# Patient Record
Sex: Male | Born: 1985 | Race: Black or African American | Hispanic: No | Marital: Single | Smoking: Never smoker
Health system: Southern US, Community
[De-identification: ages and names within clinical notes are randomized; demographics above are authoritative.]

## PROBLEM LIST (undated history)

## (undated) DIAGNOSIS — J302 Other seasonal allergic rhinitis: Secondary | ICD-10-CM

---
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
## 2007-02-17 ENCOUNTER — Emergency Department (HOSPITAL_COMMUNITY): Admission: EM | Admit: 2007-02-17 | Discharge: 2007-02-17 | Payer: Self-pay | Admitting: Emergency Medicine
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
## 2010-11-18 NOTE — Op Note (Signed)
[place]. [facility]  Patient:    Gavin Francis, Gavin Francis Visit Number: [id number] MRN: [id number]          Service Type: DSU Location: [facility] Attending Physician:  Gustavus Messing Dictated by:   Yaakov Guthrie. Shon Hough, M.D. Proc. Date: 05/20/01 Admit Date:  05/20/2001                             Operative Report  INDICATIONS:  A 25 year old who has severe, severe gynecomastia - right side greater than left - as well as increased accessory breast tissue.  PROCEDURES DONE:  Bilateral subcutaneous mastectomies as well as excision of accessory breast tissue in the latissimus dorsi/serratus anterior areas.  ATTENDING:  Yaakov Guthrie. Shon Hough, M.D.  ANESTHESIA:  General.  DESCRIPTION:  Preoperatively, the patient was set up and drawn for the inferior pedicle reduction mammoplasty.  The drawings included the centralized portion of the gynecomastia as well as tail of Spence, as well as tremendous amounts of accessory breast tissue involving the right and left lateral chest, axillary, latissimus dorsi, and upper axillary regions.  After the patient received proper general anesthesia and intubated orally, prep was done to the chest and breast areas in the routine fashion using Betadine soap and solution, walled off with sterile towels, and draped so as to make a sterile field.  Xylocaine 0.25% with epinephrine was injected locally, approximately 150 cc per side, 1:400,000 concentration.  After awaiting an appropriate amount of time for vasoconstriction to take place, curvilinear incisions were made at the areolas from the 3 oclock to 9 oclock position.  The periareolar incision was opened down to underlying subcutaneous tissue and breast tissue.  Using sharp dissection and curved Mayo scissors I was able to sharply dissect out subcutaneously the excess breast tissue which was very glandular in consistency.  We left a small tuft of tissue under each nipple to prevent  saucerization effects postoperatively.  Out laterally, more tissue was removed using fiberoptic illumination.  Next, bulky areas over the lateral breast, accessory breast tissue over the axillary, latissimus dorsi, and serratus anterior areas were removed using a liposuction technique with [place] catheter 28 - 3 and 4s, removing volumes of accessory breast tissue from the areas.  This was carried out on both sides, the right side greater than the left.  After proper hemostasis and irrigation the wounds were then reclosed in layers using 3-0 Monocryl in the subcu plane and then a running subcuticular stitch of 3-0 Monocryl.  The drains were brought out through the lateral-most portion of the chest and secured with 3-0 Prolene.  They were #10 Blake drains, fully fluted.  The wounds were cleansed; Steri-Strips and soft bulky dressings applied to all the areas.  He withstood the procedures very well and was taken to recovery in excellent condition. Dictated by:   Yaakov Guthrie. Shon Hough, M.D. Attending Physician:  Gustavus Messing DD:  05/20/01 TD:  05/20/01 Job: [id number]

## 2014-02-16 ENCOUNTER — Encounter (HOSPITAL_COMMUNITY): Payer: Self-pay | Admitting: Emergency Medicine

## 2014-02-16 ENCOUNTER — Emergency Department (HOSPITAL_COMMUNITY)
Admission: EM | Admit: 2014-02-16 | Discharge: 2014-02-16 | Disposition: A | Discharge status: Home / Self Care | Payer: Self-pay | Attending: Emergency Medicine | Admitting: Emergency Medicine

## 2014-02-16 ENCOUNTER — Emergency Department (HOSPITAL_COMMUNITY): Payer: Self-pay

## 2014-02-16 DIAGNOSIS — T63461A Toxic effect of venom of wasps, accidental (unintentional), initial encounter: Secondary | ICD-10-CM | POA: Insufficient documentation

## 2014-02-16 DIAGNOSIS — Z79899 Other long term (current) drug therapy: Secondary | ICD-10-CM | POA: Insufficient documentation

## 2014-02-16 DIAGNOSIS — Y9289 Other specified places as the place of occurrence of the external cause: Secondary | ICD-10-CM | POA: Insufficient documentation

## 2014-02-16 DIAGNOSIS — T6391XA Toxic effect of contact with unspecified venomous animal, accidental (unintentional), initial encounter: Secondary | ICD-10-CM | POA: Insufficient documentation

## 2014-02-16 DIAGNOSIS — T63481A Toxic effect of venom of other arthropod, accidental (unintentional), initial encounter: Secondary | ICD-10-CM

## 2014-02-16 DIAGNOSIS — Y9389 Activity, other specified: Secondary | ICD-10-CM | POA: Insufficient documentation

## 2014-02-16 LAB — I-STAT TROPONIN, ED: Troponin i, poc: 0 ng/mL (ref 0.00–0.08)

## 2014-02-16 LAB — CBC
HCT: 45 % (ref 39.0–52.0)
Hemoglobin: 15.8 g/dL (ref 13.0–17.0)
MCH: 29.8 pg (ref 26.0–34.0)
MCHC: 35.1 g/dL (ref 30.0–36.0)
MCV: 84.9 fL (ref 78.0–100.0)
PLATELETS: 207 10*3/uL (ref 150–400)
RBC: 5.3 MIL/uL (ref 4.22–5.81)
RDW: 13.1 % (ref 11.5–15.5)
WBC: 4.3 10*3/uL (ref 4.0–10.5)

## 2014-02-16 LAB — BASIC METABOLIC PANEL
ANION GAP: 10 (ref 5–15)
BUN: 15 mg/dL (ref 6–23)
CALCIUM: 8.8 mg/dL (ref 8.4–10.5)
CO2: 27 mEq/L (ref 19–32)
Chloride: 106 mEq/L (ref 96–112)
Creatinine, Ser: 1.13 mg/dL (ref 0.50–1.35)
GFR calc Af Amer: >90 mL/min (ref >90)
GFR, EST NON AFRICAN AMERICAN: 88 mL/min — AB (ref >90)
GLUCOSE: 111 mg/dL — AB (ref 70–99)
Potassium: 4.3 mEq/L (ref 3.7–5.3)
Sodium: 143 mEq/L (ref 137–147)

## 2014-02-16 MED ORDER — DIPHENHYDRAMINE HCL 25 MG PO CAPS
25.0000 mg | ORAL_CAPSULE | Freq: Once | ORAL | Status: AC
  Administered 2014-02-16: 25 mg via ORAL
  Filled 2014-02-16: qty 1

## 2014-02-16 MED ORDER — EPINEPHRINE 0.3 MG/0.3ML IJ SOAJ: 0.3000 mg | Freq: Once | INTRAMUSCULAR | Status: DC

## 2014-02-16 MED ORDER — DIPHENHYDRAMINE HCL 25 MG PO TABS: 25.0000 mg | ORAL_TABLET | Freq: Four times a day (QID) | ORAL | Status: DC

## 2014-02-16 NOTE — ED Provider Notes (Signed)
CSN: [id number]     Arrival date & time 02/16/14  1931 History   First MD Initiated Contact with Patient 02/16/14 2305     Chief Complaint  Patient presents with  . Chest Pain  . Insect Bite     (Consider location/radiation/quality/duration/timing/severity/associated sxs/prior Treatment) HPI 28 year old male presents to emergency department from his place of work after being stung by what he thinks to be a wasp.  He describes it as red and black.  Patient was stung on his right hand in between his fifth and fourth fingers.  A few minutes after being stung he began to feel nauseated, hot, headache and short of breath.  He describes a dull chest pain to his left lower chest.  Symptoms have since resolved.  No prior history of insect sting. History reviewed. No pertinent past medical history. History reviewed. No pertinent past surgical history. History reviewed. No pertinent family history. History  Substance Use Topics  . Smoking status: Never Smoker   . Smokeless tobacco: Not on file  . Alcohol Use: Yes     Comment: occasional    Review of Systems  See History of Present Illness; otherwise all other systems are reviewed and negative   Allergies  Hornet venom  Home Medications   Prior to Admission medications   Medication Sig Start Date End Date Taking? Authorizing Provider  hydrocortisone cream 1 % Apply 1 application topically 2 (two) times a week. Tuesdays and Thursdays (for healing at the injection site from plasma donations)   Yes Historical Provider, MD  Naphazoline-Pheniramine (OPCON-A) 0.027-0.315 % SOLN Place 1-2 drops into both eyes 3 (three) times daily as needed (allergies).   Yes Historical Provider, MD  diphenhydrAMINE (BENADRYL) 25 MG tablet Take 1 tablet (25 mg total) by mouth every 6 (six) hours. 02/16/14   Olivia Mackielga M Juwann Sherk, MD  EPINEPHrine (EPIPEN) 0.3 mg/0.3 mL IJ SOAJ injection Inject 0.3 mLs (0.3 mg total) into the muscle once. 02/16/14   Olivia Mackielga M Zahi Plaskett, MD   BP  118/77  Pulse 67  Temp(Src) 98.6 F (37 C) (Oral)  Resp 18  Ht 5\' 9"  (1.753 m)  Wt 240 lb (108.863 kg)  BMI 35.43 kg/m2  SpO2 97% Physical Exam  Nursing note and vitals reviewed. Constitutional: He is oriented to person, place, and time. He appears well-developed and well-nourished.  HENT:  Head: Normocephalic and atraumatic.  Nose: Nose normal.  Mouth/Throat: Oropharynx is clear and moist.  Eyes: Conjunctivae and EOM are normal. Pupils are equal, round, and reactive to light.  Neck: Normal range of motion. Neck supple. No JVD present. No tracheal deviation present. No thyromegaly present.  Cardiovascular: Normal rate, regular rhythm, normal heart sounds and intact distal pulses.  Exam reveals no gallop and no friction rub.   No murmur heard. Pulmonary/Chest: Effort normal and breath sounds normal. No stridor. No respiratory distress. He has no wheezes. He has no rales. He exhibits no tenderness.  Abdominal: Soft. Bowel sounds are normal. He exhibits no distension and no mass. There is no tenderness. There is no rebound and no guarding.  Musculoskeletal: Normal range of motion. He exhibits no edema and no tenderness.  Lymphadenopathy:    He has no cervical adenopathy.  Neurological: He is alert and oriented to person, place, and time. He has normal reflexes. No cranial nerve deficit. He exhibits normal muscle tone. Coordination normal.  Skin: Skin is warm and dry. No rash noted. No erythema. No pallor.  Patient has what appears to be in  sats staying in the webbing between fifth and fourth fingers on right hand.  There is a small amount of localized soft tissue swelling.  Psychiatric: He has a normal mood and affect. His behavior is normal. Judgment and thought content normal.    ED Course  Procedures (including critical care time) Labs Review Labs Reviewed  BASIC METABOLIC PANEL - Abnormal; Notable for the following:    Glucose, Bld 111 (*)    GFR calc non Af Amer 88 (*)    All  other components within normal limits  CBC  I-STAT TROPOININ, ED    Imaging Review Dg Chest 2 View  02/16/2014   CLINICAL DATA:  Shortness of breath.  Recent wasp sting.  EXAM: CHEST  2 VIEW  COMPARISON:  10/01/2006  FINDINGS: There is interstitial coarsening which is likely from hypoaeration with interstitial crowding. No edema or consolidation. No effusion or pneumothorax. Normal heart size at upper mediastinal contours.  IMPRESSION: Mildly low lung volumes.  No acute findings.   Electronically Signed   By: Tiburcio Pea M.D.   On: 02/16/2014 22:47     EKG Interpretation   Date/Time:  Monday February 16 2014 19:40:19 EDT Ventricular Rate:  78 PR Interval:  166 QRS Duration: 74 QT Interval:  344 QTC Calculation: 392 R Axis:   50 Text Interpretation:  Normal sinus rhythm Nonspecific T wave abnormality  Abnormal ECG No old tracing to compare Confirmed by Kanan Sobek  MD, Oza Oberle  ([id number]) on 02/16/2014 11:03:50 PM      MDM   Final diagnoses:  Insect sting, accidental or unintentional, initial encounter  Allergic reaction to insect sting, accidental or unintentional, initial encounter    28 year old male with insect sting and possible systemic reaction.  Patient without symptoms at this time.  Will start on Benadryl, get patient EpiPen prescription.    Olivia Mackie, MD 02/16/14 [phone]

## 2014-02-16 NOTE — ED Notes (Signed)
Patient here with complaint of chest pain and bee sting to right hand. States that he was stung by a large red and black insect. Shortly after the sting he began to feel lower left chest pain. Endorses shortness of breath, dizziness, and lightheadedness. Appears uncomfortable in triage. States no history of the same.

## 2014-02-16 NOTE — Discharge Instructions (Signed)

## 2015-11-17 ENCOUNTER — Emergency Department (HOSPITAL_BASED_OUTPATIENT_CLINIC_OR_DEPARTMENT_OTHER): Payer: Self-pay

## 2015-11-17 ENCOUNTER — Encounter (HOSPITAL_BASED_OUTPATIENT_CLINIC_OR_DEPARTMENT_OTHER): Payer: Self-pay

## 2015-11-17 ENCOUNTER — Emergency Department (HOSPITAL_BASED_OUTPATIENT_CLINIC_OR_DEPARTMENT_OTHER)
Admission: EM | Admit: 2015-11-17 | Discharge: 2015-11-17 | Disposition: A | Discharge status: Home / Self Care | Payer: Self-pay | Attending: Emergency Medicine | Admitting: Emergency Medicine

## 2015-11-17 DIAGNOSIS — N50819 Testicular pain, unspecified: Secondary | ICD-10-CM

## 2015-11-17 DIAGNOSIS — R1030 Lower abdominal pain, unspecified: Secondary | ICD-10-CM

## 2015-11-17 DIAGNOSIS — N451 Epididymitis: Secondary | ICD-10-CM | POA: Insufficient documentation

## 2015-11-17 HISTORY — DX: Other seasonal allergic rhinitis: J30.2

## 2015-11-17 LAB — URINALYSIS, ROUTINE W REFLEX MICROSCOPIC
Bilirubin Urine: NEGATIVE
GLUCOSE, UA: NEGATIVE mg/dL
Hgb urine dipstick: NEGATIVE
Ketones, ur: NEGATIVE mg/dL
LEUKOCYTES UA: NEGATIVE
Nitrite: NEGATIVE
PH: 7.5 (ref 5.0–8.0)
Protein, ur: NEGATIVE mg/dL
SPECIFIC GRAVITY, URINE: 1.016 (ref 1.005–1.030)

## 2015-11-17 LAB — COMPREHENSIVE METABOLIC PANEL
ALK PHOS: 33 U/L — AB (ref 38–126)
ALT: 27 U/L (ref 17–63)
AST: 22 U/L (ref 15–41)
Albumin: 3.7 g/dL (ref 3.5–5.0)
Anion gap: 6 (ref 5–15)
BILIRUBIN TOTAL: 1.3 mg/dL — AB (ref 0.3–1.2)
BUN: 9 mg/dL (ref 6–20)
CALCIUM: 8.7 mg/dL — AB (ref 8.9–10.3)
CO2: 30 mmol/L (ref 22–32)
CREATININE: 0.94 mg/dL (ref 0.61–1.24)
Chloride: 102 mmol/L (ref 101–111)
Glucose, Bld: 90 mg/dL (ref 65–99)
Potassium: 4.1 mmol/L (ref 3.5–5.1)
Sodium: 138 mmol/L (ref 135–145)
Total Protein: 6.9 g/dL (ref 6.5–8.1)

## 2015-11-17 LAB — CBC
HEMATOCRIT: 38.1 % — AB (ref 39.0–52.0)
HEMOGLOBIN: 13.3 g/dL (ref 13.0–17.0)
MCH: 29.2 pg (ref 26.0–34.0)
MCHC: 34.9 g/dL (ref 30.0–36.0)
MCV: 83.7 fL (ref 78.0–100.0)
Platelets: 214 10*3/uL (ref 150–400)
RBC: 4.55 MIL/uL (ref 4.22–5.81)
RDW: 13.5 % (ref 11.5–15.5)
WBC: 4.4 10*3/uL (ref 4.0–10.5)

## 2015-11-17 MED ORDER — IOPAMIDOL (ISOVUE-300) INJECTION 61%
100.0000 mL | Freq: Once | INTRAVENOUS | Status: AC | PRN
  Administered 2015-11-17: 100 mL via INTRAVENOUS

## 2015-11-17 MED ORDER — HYDROCODONE-ACETAMINOPHEN 5-325 MG PO TABS: 1.0000 | ORAL_TABLET | Freq: Four times a day (QID) | ORAL | Status: AC | PRN

## 2015-11-17 MED ORDER — CIPROFLOXACIN HCL 500 MG PO TABS: 500.0000 mg | ORAL_TABLET | Freq: Two times a day (BID) | ORAL | Status: AC

## 2015-11-17 NOTE — ED Provider Notes (Signed)
CSN: [id number]     Arrival date & time 11/17/15  1358 History   First MD Initiated Contact with Patient 11/17/15 1428     Chief Complaint  Patient presents with  . Groin Pain     (Consider location/radiation/quality/duration/timing/severity/associated sxs/prior Treatment) Patient is a 30 y.o. male presenting with groin pain. The history is provided by the patient. No language interpreter was used.  Groin Pain This is a new problem. The current episode started in the past 7 days. The problem occurs constantly. The problem has been gradually worsening. Associated symptoms include abdominal pain. Pertinent negatives include no nausea. Nothing aggravates the symptoms. He has tried nothing for the symptoms. The treatment provided no relief.  Pt reports he [place] to [place].  Pt reports he developed a hemorhoid.  Pt used TUCKs and swelling decreased. Pt reports increasing rectal pain.  Pt reports pain is stabbing.  Pt has pain shoot down his legs.  Pt complains of pain in right scrotum. Pt reports sore to touch.   Past Medical History  Diagnosis Date  . Seasonal allergies    History reviewed. No pertinent past surgical history. No family history on file. Social History  Substance Use Topics  . Smoking status: Never Smoker   . Smokeless tobacco: None  . Alcohol Use: No    Review of Systems  Gastrointestinal: Positive for abdominal pain. Negative for nausea.  All other systems reviewed and are negative.     Allergies  Hornet venom and Wasp venom  Home Medications   Prior to Admission medications   Not on File   BP 131/97 mmHg  Pulse 70  Temp(Src) 99 F (37.2 C) (Oral)  Resp 20  Ht 5\' 9"  (1.753 m)  Wt 113.399 kg  BMI 36.90 kg/m2  SpO2 98% Physical Exam  Constitutional: He is oriented to person, place, and time. He appears well-developed and well-nourished.  HENT:  Head: Normocephalic.  Right Ear: External ear normal.  Left Ear: External ear normal.  Nose: Nose  normal.  Mouth/Throat: Oropharynx is clear and moist.  Eyes: Conjunctivae and EOM are normal. Pupils are equal, round, and reactive to light.  Neck: Normal range of motion.  Cardiovascular: Normal rate and regular rhythm.   Pulmonary/Chest: Effort normal.  Abdominal: He exhibits no distension. There is tenderness.  Genitourinary:  Tender right scrotum to palpation.  Rectum  Tender,  external hemorrhoid.   Musculoskeletal: Normal range of motion.  Neurological: He is alert and oriented to person, place, and time.  Psychiatric: He has a normal mood and affect.  Nursing note and vitals reviewed.   ED Course  Procedures (including critical care time) Labs Review Labs Reviewed  URINALYSIS, ROUTINE W REFLEX MICROSCOPIC [facility])    Imaging Review No results found. I have personally reviewed and evaluated these images and lab results as part of my medical decision-making.   EKG Interpretation None      MDM   Final diagnoses:  Epididymitis  Lower abdominal pain        Elson AreasLeslie K Sela Falk, PA-C 11/18/15 [phone]  Geoffery Lyonsouglas Delo, MD 11/23/15 [phone]

## 2015-11-17 NOTE — ED Notes (Signed)
Patient transported to CT 

## 2015-11-17 NOTE — Discharge Instructions (Signed)
Cipro as prescribed.  Hydrocodone as prescribed as needed for pain.  Follow-up with your primary Dr. if not improving in the next week.   Epididymitis Epididymitis is swelling (inflammation) of the epididymis. The epididymis is a cord-like structure that is located along the top and back part of the testicle. It collects and stores sperm from the testicle. This condition can also cause pain and swelling of the testicle and scrotum. Symptoms usually start suddenly (acute epididymitis). Sometimes epididymitis starts gradually and lasts for a while (chronic epididymitis). This type may be harder to treat. CAUSES In men 17 and younger, this condition is usually caused by a bacterial infection or sexually transmitted disease (STD), such as:  Gonorrhea.  Chlamydia.  In men 56 and older who do not have anal sex, this condition is usually caused by bacteria from a blockage or abnormalities in the urinary system. These can result from:  Having a tube placed into the bladder (urinary catheter).  Having an enlarged or inflamed prostate gland.  Having recent urinary tract surgery. In men who have a condition that weakens the body's defense system (immune system), such as HIV, this condition can be caused by:   Other bacteria, including tuberculosis and syphilis.  Viruses.  Fungi. Sometimes this condition occurs without infection. That may happen if urine flows backward into the epididymis after heavy lifting or straining. RISK FACTORS This condition is more likely to develop in men:  Who have unprotected sex with more than one partner.  Who have anal sex.   Who have recently had surgery.   Who have a urinary catheter.  Who have urinary problems.  Who have a suppressed immune system. SYMPTOMS  This condition usually begins suddenly with chills, fever, and pain behind the scrotum and in the testicle. Other symptoms include:   Swelling of the scrotum, testicle, or both.  Pain  whenejaculatingor urinating.  Pain in the back or belly.  Nausea.  Itching and discharge from the penis.  Frequent need to pass urine.  Redness and tenderness of the scrotum. DIAGNOSIS Your health care provider can diagnose this condition based on your symptoms and medical history. Your health care provider will also do a physical exam to ask about your symptoms and check your scrotum and testicle for swelling, pain, and redness. You may also have other tests, including:   Examination of discharge from the penis.  Urine tests for infections, such as STDs.  Your health care provider may test you for other STDs, including HIV. TREATMENT Treatment for this condition depends on the cause. If your condition is caused by a bacterial infection, oral antibiotic medicine may be prescribed. If the bacterial infection has spread to your blood, you may need to receive IV antibiotics. Nonbacterial epididymitis is treated with home care that includes bed rest and elevation of the scrotum. Surgery may be needed to treat:  Bacterial epididymitis that causes pus to build up in the scrotum (abscess).  Chronic epididymitis that has not responded to other treatments. HOME CARE INSTRUCTIONS Medicines  Take over-the-counter and prescription medicines only as told by your health care provider.   If you were prescribed an antibiotic medicine, take it as told by your health care provider. Do not stop taking the antibiotic even if your condition improves. Sexual Activity  If your epididymitis was caused by an STD, avoid sexual activity until your treatment is complete.  Inform your sexual partner or partners if you test positive for an STD. They may need to be  treated.Do not engage in sexual activity with your partner or partners until their treatment is completed. General Instructions  Return to your normal activities as told by your health care provider. Ask your health care provider what  activities are safe for you.  Keep your scrotum elevated and supported while resting. Ask your health care provider if you should wear a scrotal support, such as a jockstrap. Wear it as told by your health care provider.  If directed, apply ice to the affected area:   Put ice in a plastic bag.  Place a towel between your skin and the bag.  Leave the ice on for 20 minutes, 2-3 times per day.  Try taking a sitz bath to help with discomfort. This is a warm water bath that is taken while you are sitting down. The water should only come up to your hips and should cover your buttocks. Do this 3-4 times per day or as told by your health care provider.  Keep all follow-up visits as told by your health care provider. This is important. SEEK MEDICAL CARE IF:   You have a fever.   Your pain medicine is not helping.   Your pain is getting worse.   Your symptoms do not improve within three days.   This information is not intended to replace advice given to you by your health care provider. Make sure you discuss any questions you have with your health care provider.   Document Released: 06/16/2000 Document Revised: 03/10/2015 Document Reviewed: 11/04/2014 Elsevier Interactive Patient Education [employer]

## 2015-11-17 NOTE — ED Notes (Signed)
MD at bedside. 

## 2015-11-17 NOTE — ED Notes (Signed)
C/o rectal, groin pain and pain down down both legs x 6 days-NAD-slow steady gait

## 2015-11-18 MED FILL — HYDROCODON-APAP 5-325: 5-325 | 2 days supply | Qty: 10 | Fill #0

## 2015-11-18 MED FILL — CIPROFLOXACIN HCL 500 MG TA: 500 | 10 days supply | Qty: 20 | Fill #0

## 2016-09-19 IMAGING — US US SCROTUM
1 series · 14 of 25 positions shown · non-contrast
Comparison: CT 10/01/2006.

CLINICAL DATA: Testicular pain.

EXAM:
SCROTAL ULTRASOUND
DOPPLER ULTRASOUND OF THE TESTICLES
TECHNIQUE: Complete ultrasound examination of the testicles, epididymis, and
other scrotal structures was performed. Color and spectral Doppler
ultrasound were also utilized to evaluate blood flow to the
testicles.

[Series 1: us scrotum · 0.07mm/px · 14 of 51 slices shown]
[im 1/51]
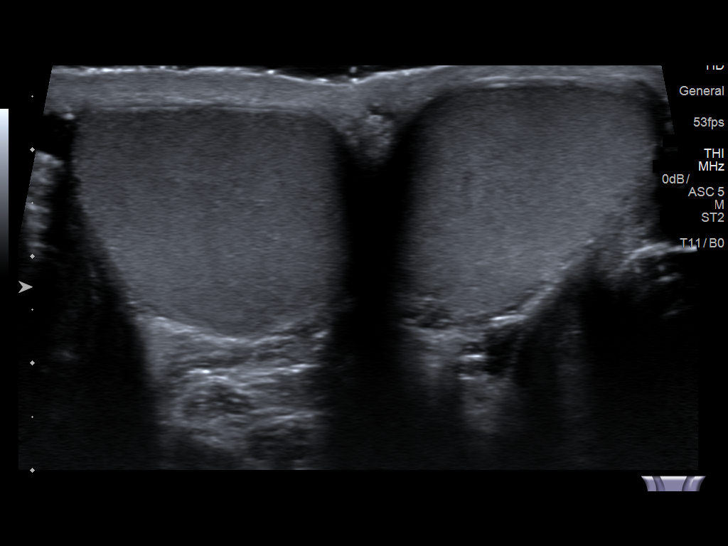
[im 5/51]
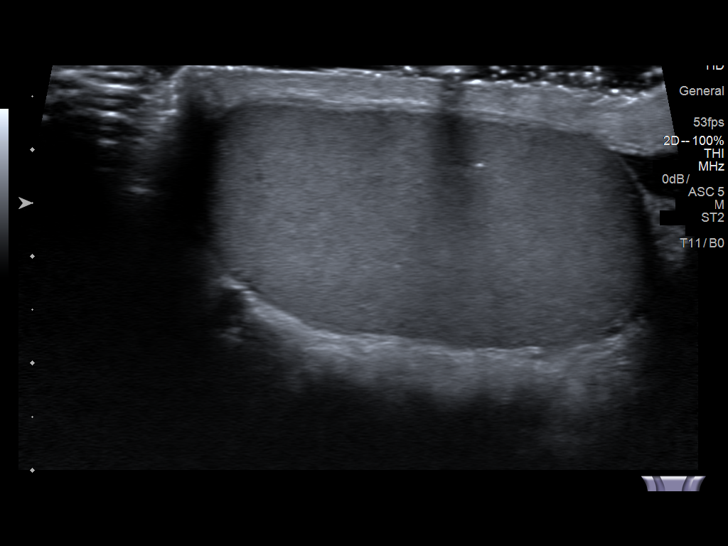
[im 9/51]
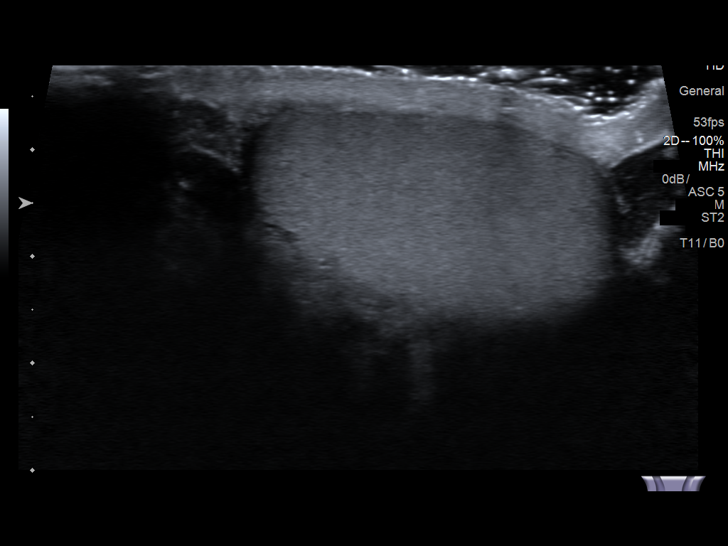
[im 13/51]
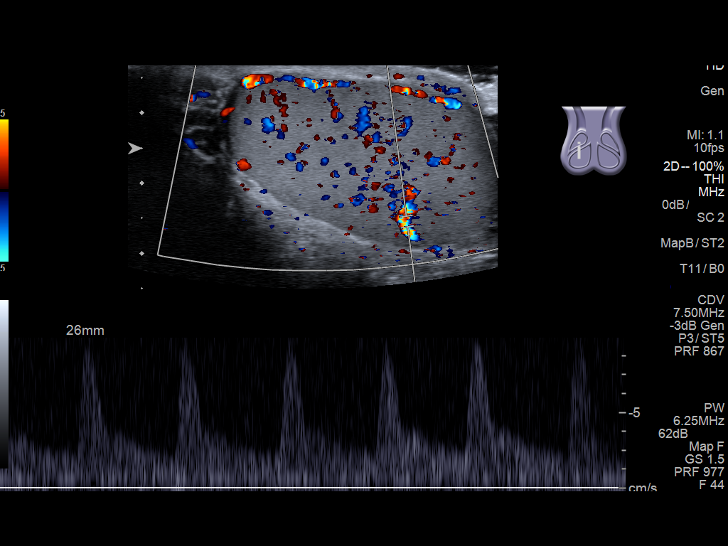
[im 17/51]
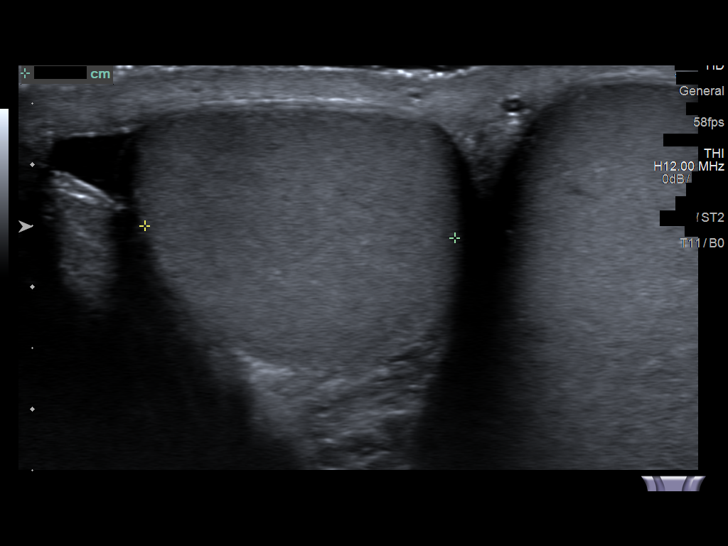
[im 19/51]
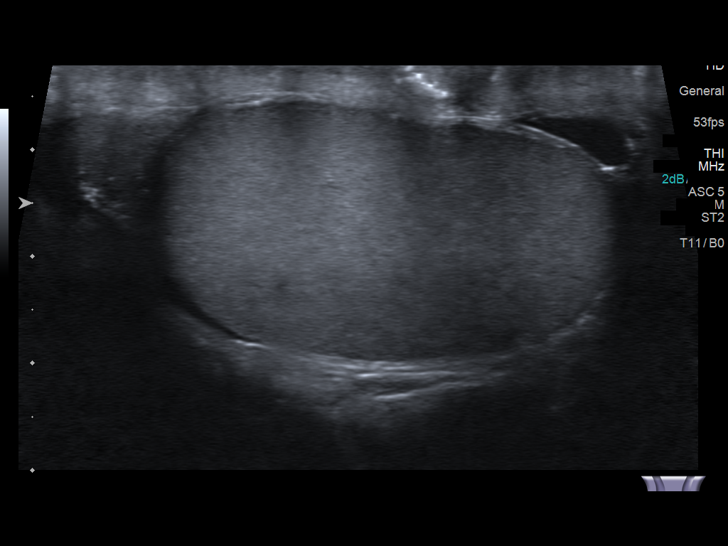
[im 23/51]
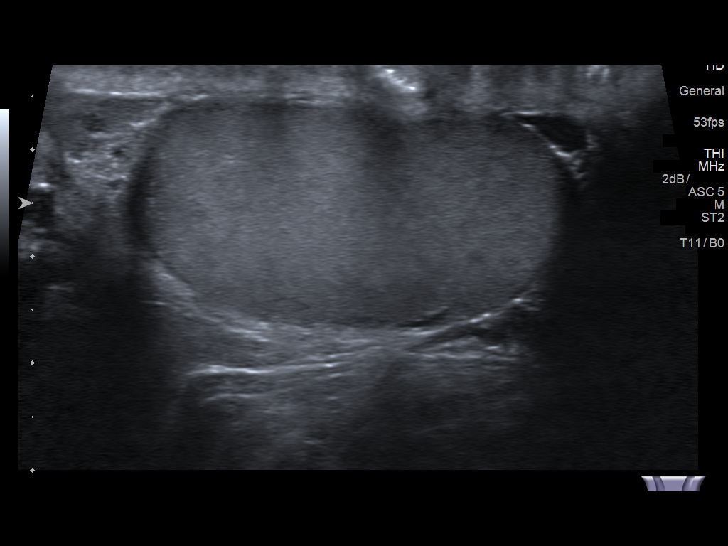
[im 28/51]
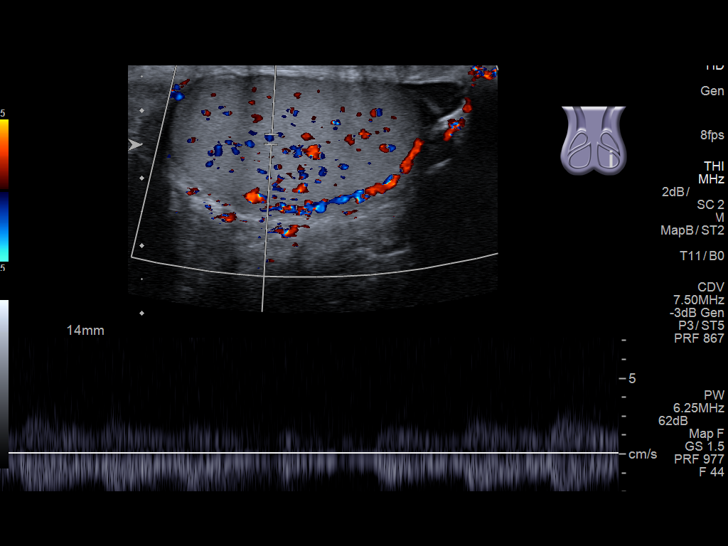
[im 32/51]
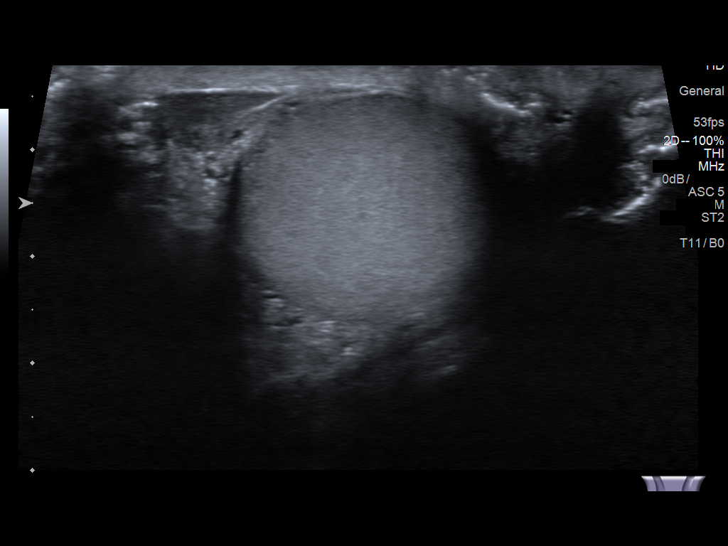
[im 34/51]
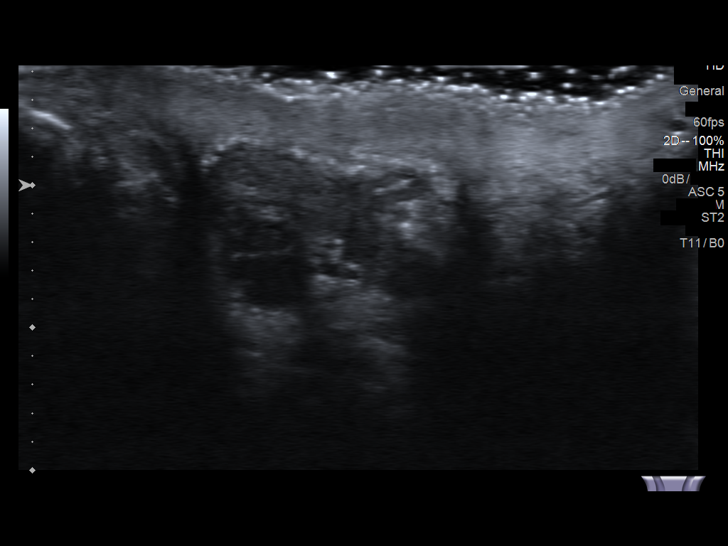
[im 38/51]
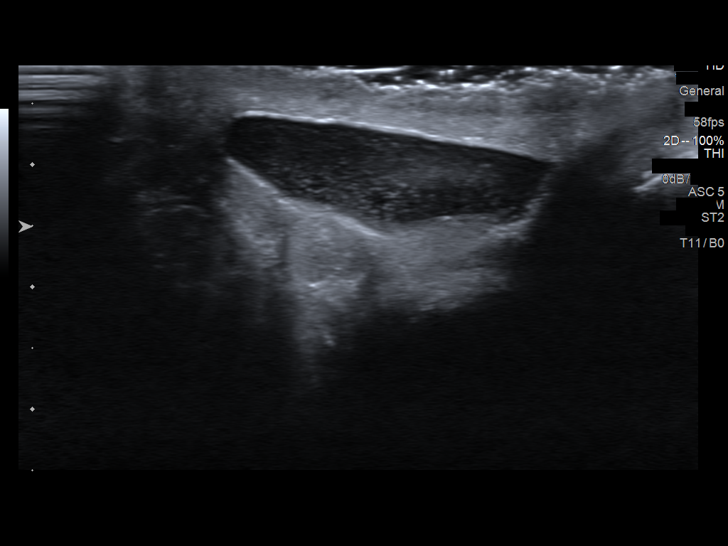
[im 42/51]
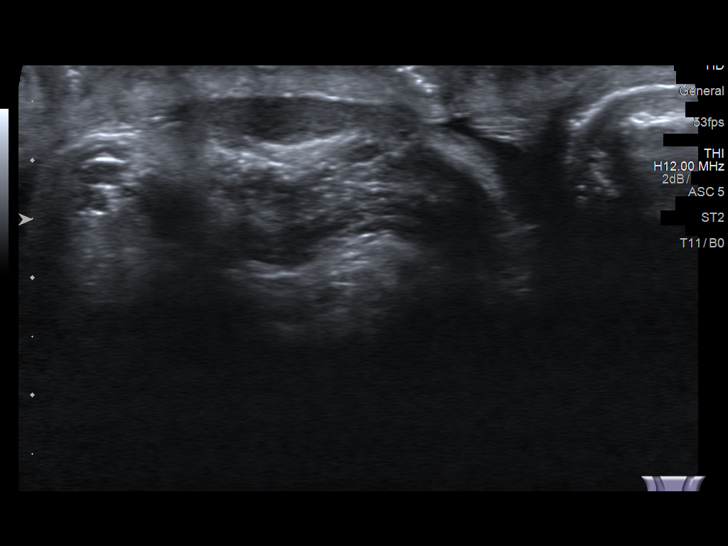
[im 46/51]
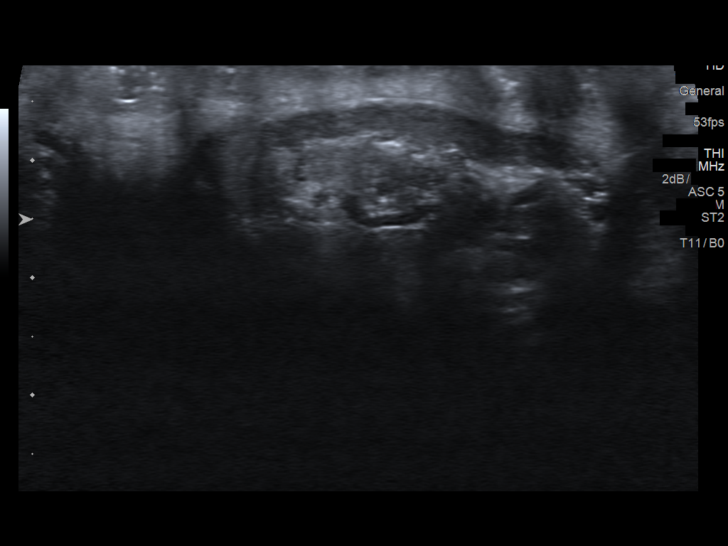
[im 51/51]
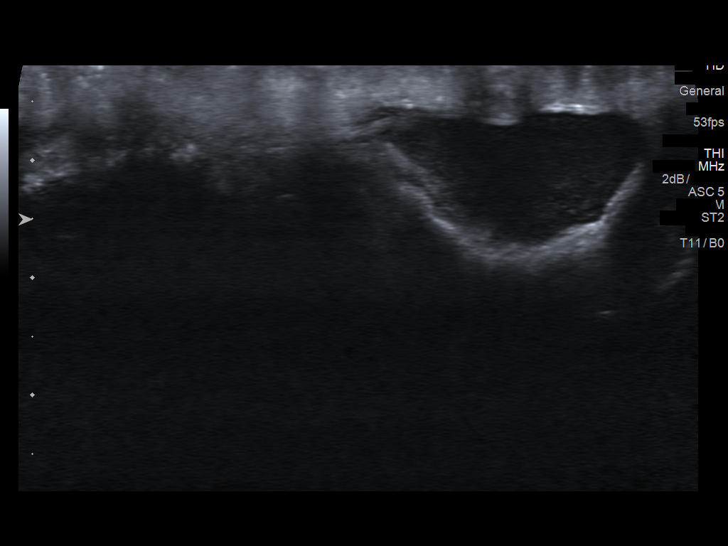

[14 of 25 positions shown; findings below may reference images not displayed]

FINDINGS: Right testicle

Measurements: 4.0 x 2.3 x 2.5 cm. No mass or microlithiasis
visualized.

Left testicle

Measurements: 4.3 x 2.4 x 2.6 cm. No mass or microlithiasis
visualized.

Right epididymis: Mild increased flow. Mild epididymitis cannot be
excluded.

Left epididymis:  Normal in size and appearance.

Hydrocele:  Mild bilateral hydroceles.

Varicocele:  None visualized.

Pulsed Doppler interrogation of both testes demonstrates normal low
resistance arterial and venous waveforms bilaterally.
IMPRESSION: 1. Mild increase 5 right epididymis. Mild epididymitis cannot be
completely excluded.

2. Mild bilateral hydroceles. No evidence of testicular torsion or
mass.

## 2021-07-10 ENCOUNTER — Other Ambulatory Visit: Payer: Self-pay

## 2021-07-10 ENCOUNTER — Emergency Department (HOSPITAL_COMMUNITY)
Admission: EM | Admit: 2021-07-10 | Discharge: 2021-07-10 | Disposition: A | Discharge status: Home / Self Care | Payer: 59 | Attending: Emergency Medicine | Admitting: Emergency Medicine

## 2021-07-10 ENCOUNTER — Emergency Department (HOSPITAL_COMMUNITY): Payer: 59

## 2021-07-10 DIAGNOSIS — R079 Chest pain, unspecified: Secondary | ICD-10-CM

## 2021-07-10 DIAGNOSIS — R1012 Left upper quadrant pain: Secondary | ICD-10-CM | POA: Diagnosis not present

## 2021-07-10 DIAGNOSIS — R002 Palpitations: Secondary | ICD-10-CM | POA: Diagnosis not present

## 2021-07-10 DIAGNOSIS — R0789 Other chest pain: Secondary | ICD-10-CM | POA: Insufficient documentation

## 2021-07-10 DIAGNOSIS — Z79899 Other long term (current) drug therapy: Secondary | ICD-10-CM | POA: Diagnosis not present

## 2021-07-10 DIAGNOSIS — M542 Cervicalgia: Secondary | ICD-10-CM | POA: Diagnosis not present

## 2021-07-10 LAB — HEPATIC FUNCTION PANEL
ALT: 33 U/L (ref 0–44)
AST: 26 U/L (ref 15–41)
Albumin: 3.8 g/dL (ref 3.5–5.0)
Alkaline Phosphatase: 42 U/L (ref 38–126)
Bilirubin, Direct: 0.1 mg/dL (ref 0.0–0.2)
Indirect Bilirubin: 0.8 mg/dL (ref 0.3–0.9)
Total Bilirubin: 0.9 mg/dL (ref 0.3–1.2)
Total Protein: 6.8 g/dL (ref 6.5–8.1)

## 2021-07-10 LAB — BASIC METABOLIC PANEL
Anion gap: 9 (ref 5–15)
BUN: 10 mg/dL (ref 6–20)
CO2: 29 mmol/L (ref 22–32)
Calcium: 8.8 mg/dL — ABNORMAL LOW (ref 8.9–10.3)
Chloride: 101 mmol/L (ref 98–111)
Creatinine, Ser: 1.06 mg/dL (ref 0.61–1.24)
GFR, Estimated: >60 mL/min (ref >60)
Glucose, Bld: 125 mg/dL — ABNORMAL HIGH (ref 70–99)
Potassium: 3.4 mmol/L — ABNORMAL LOW (ref 3.5–5.1)
Sodium: 139 mmol/L (ref 135–145)

## 2021-07-10 LAB — LIPASE, BLOOD: Lipase: 34 U/L (ref 11–51)

## 2021-07-10 LAB — TROPONIN I (HIGH SENSITIVITY)
Troponin I (High Sensitivity): 4 ng/L (ref <18)
Troponin I (High Sensitivity): 5 ng/L (ref <18)

## 2021-07-10 LAB — CBC
HCT: 41.2 % (ref 39.0–52.0)
Hemoglobin: 13.8 g/dL (ref 13.0–17.0)
MCH: 28.7 pg (ref 26.0–34.0)
MCHC: 33.5 g/dL (ref 30.0–36.0)
MCV: 85.7 fL (ref 80.0–100.0)
Platelets: 220 10*3/uL (ref 150–400)
RBC: 4.81 MIL/uL (ref 4.22–5.81)
RDW: 12.9 % (ref 11.5–15.5)
WBC: 5.4 10*3/uL (ref 4.0–10.5)
nRBC: 0 % (ref 0.0–0.2)

## 2021-07-10 LAB — MAGNESIUM: Magnesium: 2.2 mg/dL (ref 1.7–2.4)

## 2021-07-10 MED ORDER — KETOROLAC TROMETHAMINE 30 MG/ML IJ SOLN
30.0000 mg | Freq: Once | INTRAMUSCULAR | Status: AC
  Administered 2021-07-10: 30 mg via INTRAVENOUS
  Filled 2021-07-10: qty 1

## 2021-07-10 MED ORDER — METHOCARBAMOL 500 MG PO TABS
500.0000 mg | ORAL_TABLET | Freq: Two times a day (BID) | ORAL | 0 refills | Status: AC
Start: 2021-07-10 — End: ?

## 2021-07-10 MED ORDER — NAPROXEN 500 MG PO TABS: 500.0000 mg | ORAL_TABLET | Freq: Two times a day (BID) | ORAL | 0 refills | Status: DC

## 2021-07-10 MED ORDER — METHOCARBAMOL 500 MG PO TABS
500.0000 mg | ORAL_TABLET | Freq: Once | ORAL | Status: AC
  Administered 2021-07-10: 500 mg via ORAL
  Filled 2021-07-10: qty 1

## 2021-07-10 MED ORDER — LIDOCAINE 5 % EX PTCH
1.0000 | MEDICATED_PATCH | Freq: Once | CUTANEOUS | Status: DC
  Administered 2021-07-10: 1 via TRANSDERMAL
  Filled 2021-07-10: qty 1

## 2021-07-10 MED ORDER — METHYLPREDNISOLONE 4 MG PO TBPK: ORAL_TABLET | ORAL | 0 refills | Status: AC

## 2021-07-10 NOTE — ED Triage Notes (Signed)
Pt reported to ED with c/o left sided chest pain that radiates into arm and neck with intermittent SOB. Pt states symptoms have been ongoing for a couple of days. Denies nausea or vomiting or significant cardiac history.

## 2021-07-10 NOTE — ED Provider Notes (Signed)
[facility]   CSN: [id number] Arrival date & time: 07/10/21  0113     History  Chief Complaint  Patient presents with   Chest Pain    Gavin Francis is a 36 y.o. male.  Gavin Francis is a 36 y.o. male with a history of allergies and high blood pressure, who presents to the emergency department for evaluation of chest pain and chest fluttering.  Patient reports for the past week he has intermittently felt like his heart is fluttering.  Symptoms lasted few minutes and then seemed to resolve.  He denies current palpitations.  He also reports over the past 2 to 3 days he has had a constant dull ache in his left upper chest around his collarbone as well as in his left neck and going down his left arm.  This pain seems to be worse with movement, not exertional or pleuritic in nature.  Reports sometimes when pain is severe he feels a bit short of breath.  He also reports some left upper abdominal pain, no vomiting or diarrhea.  No lightheadedness or syncope.  Reports intermittent posterior headache. Denies alcohol or drug use.  Reports that he was seen about 2 years ago for some similar chest pains at a [facility] hospital, at that time he was admitted, had negative troponins and a Lexiscan stress test that showed low risk.  He was noted to be hypertensive then but wanted to avoid medication and try diet and exercise, has not followed up with anyone about his blood pressure since this.  Patient has never been on any blood pressure medications.  The history is provided by the patient.      Home Medications Prior to Admission medications   Medication Sig Start Date End Date Taking? Authorizing Provider  methocarbamol (ROBAXIN) 500 MG tablet Take 1 tablet (500 mg total) by mouth 2 (two) times daily. 07/10/21  Yes Dartha LodgeFord, Derik Fults N, PA-C  methylPREDNISolone (MEDROL DOSEPAK) 4 MG TBPK tablet Take as directed 07/10/21  Yes Dartha LodgeFord, Jakaylah Schlafer N, PA-C  naproxen  (NAPROSYN) 500 MG tablet Take 1 tablet (500 mg total) by mouth 2 (two) times daily. 07/10/21  Yes Dartha LodgeFord, Drequan Ironside N, PA-C  ciprofloxacin (CIPRO) 500 MG tablet Take 1 tablet (500 mg total) by mouth 2 (two) times daily. Patient not taking: Reported on 07/10/2021 11/17/15   Geoffery Lyonselo, Douglas, MD  HYDROcodone-acetaminophen (NORCO) 5-325 MG tablet Take 1-2 tablets by mouth every 6 (six) hours as needed. Patient not taking: Reported on 07/10/2021 11/17/15   Geoffery Lyonselo, Douglas, MD      Allergies    Wasp venom and Hornet venom    Review of Systems   Review of Systems  Constitutional:  Negative for chills and fever.  HENT: Negative.    Respiratory:  Negative for cough and shortness of breath.   Cardiovascular:  Positive for chest pain and palpitations. Negative for leg swelling.  Gastrointestinal:  Positive for abdominal pain. Negative for nausea and vomiting.  Genitourinary:  Negative for dysuria.  Musculoskeletal:  Positive for arthralgias and neck pain.  Neurological:  Positive for headaches. Negative for weakness and numbness.  All other systems reviewed and are negative.  Physical Exam Updated Vital Signs BP (!) 158/113 (BP Location: Left Arm)    Pulse 70    Temp 97.8 F (36.6 C)    Resp 16    Ht 5\' 9"  (1.753 m)    Wt 122.5 kg    SpO2 97%  BMI 39.87 kg/m  Physical Exam Vitals and nursing note reviewed.  Constitutional:      General: He is not in acute distress.    Appearance: Normal appearance. He is well-developed. He is obese. He is not ill-appearing or diaphoretic.  HENT:     Head: Normocephalic and atraumatic.  Eyes:     General:        Right eye: No discharge.        Left eye: No discharge.     Pupils: Pupils are equal, round, and reactive to light.  Neck:     Comments: Tenderness over the left paraspinal muscles.  No masses, no focal midline or step-off.  Palpable spasm over the left trapezius muscle.  Positive Spurling maneuver on the left Cardiovascular:     Rate and Rhythm: Normal rate  and regular rhythm.     Pulses: Normal pulses.          Radial pulses are 2+ on the right side and 2+ on the left side.     Heart sounds: Normal heart sounds. No murmur heard.   No friction rub. No gallop.  Pulmonary:     Effort: Pulmonary effort is normal. No respiratory distress.     Breath sounds: Normal breath sounds. No wheezing or rales.     Comments: Respirations equal and unlabored, patient able to speak in full sentences, lungs clear to auscultation bilaterally  Chest:     Chest wall: Tenderness present.     Comments: Tenderness over the left side of the chest primarily in the left upper chest wall, no overlying skin changes. Abdominal:     General: Bowel sounds are normal. There is no distension.     Palpations: Abdomen is soft. There is no mass.     Tenderness: There is abdominal tenderness. There is no guarding.     Comments: Abdomen soft, nondistended, mild left upper quadrant abdominal tenderness without guarding or peritoneal signs, all other quadrants nontender to palpation.  Musculoskeletal:        General: No deformity.     Cervical back: Neck supple.     Right lower leg: No tenderness. No edema.     Left lower leg: No tenderness. No edema.  Skin:    General: Skin is warm and dry.     Capillary Refill: Capillary refill takes less than 2 seconds.  Neurological:     Mental Status: He is alert and oriented to person, place, and time.     Coordination: Coordination normal.     Comments: Speech is clear, able to follow commands CN III-XII intact Normal strength in upper and lower extremities bilaterally including dorsiflexion and plantar flexion, strong and equal grip strength Sensation normal to light and sharp touch Moves extremities without ataxia, coordination intact  Psychiatric:        Mood and Affect: Mood normal.        Behavior: Behavior normal.    ED Results / Procedures / Treatments   Labs (all labs ordered are listed, but only abnormal results are  displayed) Labs Reviewed  BASIC METABOLIC PANEL - Abnormal; Notable for the following components:      Result Value   Potassium 3.4 (*)    Glucose, Bld 125 (*)    Calcium 8.8 (*)    All other components within normal limits  CBC  MAGNESIUM  LIPASE, BLOOD  HEPATIC FUNCTION PANEL  TROPONIN I (HIGH SENSITIVITY)  TROPONIN I (HIGH SENSITIVITY)    EKG EKG Interpretation  Date/Time:  Sunday July 10 2021 01:14:59 EST Ventricular Rate:  77 PR Interval:  194 QRS Duration: 76 QT Interval:  376 QTC Calculation: 425 R Axis:   37 Text Interpretation: Normal sinus rhythm Low voltage QRS T wave abnormality, consider inferolateral ischemia Abnormal ECG When compared with ECG of 16-Feb-2014 19:40, No significant change was found Confirmed by Dione Booze ([id number]) on 07/10/2021 1:29:43 AM  Radiology DG Chest 2 View  Result Date: 07/10/2021 CLINICAL DATA:  Chest pain and shortness of breath. EXAM: CHEST - 2 VIEW COMPARISON:  Chest radiograph dated 02/16/2014. FINDINGS: The heart size and mediastinal contours are within normal limits. Both lungs are clear. The visualized skeletal structures are unremarkable. IMPRESSION: No active cardiopulmonary disease. Electronically Signed   By: Elgie Collard M.D.   On: 07/10/2021 02:04    Procedures Procedures    Medications Ordered in ED Medications  lidocaine (LIDODERM) 5 % 1 patch (1 patch Transdermal Patch Applied 07/10/21 0924)  methocarbamol (ROBAXIN) tablet 500 mg (500 mg Oral Given 07/10/21 0921)  ketorolac (TORADOL) 30 MG/ML injection 30 mg (30 mg Intravenous Given 07/10/21 8916)    ED Course/ Medical Decision Making/ A&P                           Medical Decision Making  36 y.o. male presents to the ED with complaints of chest pain, neck pain, palpitations and headache, this involves an extensive number of treatment options, and is a complaint that carries with it a high risk of complications and morbidity.  The differential diagnosis includes  ACS, arrhythmia, PE, dissection, pneumonia, pneumothorax, musculoskeletal neck pain, cervical radiculopathy, gastritis, PUD, GERD, musculoskeletal chest pain.   On arrival pt is nontoxic, vitals significant for hypertension, otherwise normal. Exam significant for tenderness over the left upper chest and left trapezius muscle.  Positive Spurling maneuver on the left and this reproduces the pain he has been experiencing in his neck, left upper chest and left arm.  Also has some mild left upper quadrant tenderness without guarding or peritoneal signs.  Additional history obtained from significant other at bedside. Previous records obtained and reviewed via EMR, patient had cardiac work-up at [facility] in January 2021 with low risk Lexiscan stress test and normal troponins at this time.  I ordered Toradol, Robaxin and Lidoderm patches   Lab Tests:  I Ordered, reviewed, and interpreted labs, which included:  CBC: No leukocytosis, normal hgb BMP: Potassium of 3.4, encourage increased dietary intake, glucose 125, no other significant electrolyte derangements and renal function Troponin: Negative x2 LFTs: WNL Lipase: WNL Mag: WNL   Imaging Studies ordered:  I ordered imaging studies which included chest x-ray, I independently visualized and interpreted imaging which showed no active cardiopulmonary disease.  Patient did have some mild left upper quadrant tenderness on abdominal exam, considered abdominal imaging but given reassuring lab work and that pain has resolved with treatment do not feel that it is indicated emergently during patient's encounter.  ED Course:   Heart Pathway Score 2 - EKG without obvious acute ischemia, delta troponin negative, doubt ACS. Patient is low risk wells, PERC negative, doubt pulmonary embolism. Pain is not a tearing sensation, symmetric pulses, no widening of mediastinum on CXR, doubt dissection.  Patient has tenderness and palpable spasm over the left trapezius muscle  with positive Spurling maneuver, that recreates left upper chest pain.  This is also reproducible with palpation over the left upper chest and I suspect this is  primarily musculoskeletal.  We will treat with NSAIDs, Robaxin and Medrol Dosepak.  Patient has also had some palpitations.  Cardiac monitor reviewed, no notable arrhythmias or tachycardia. Patient has appeared hemodynamically stable throughout ER visit initial hypertension has significantly improved with treatment and pain so we will hold off on starting any blood pressure medications today but will have patient follow-up closely.  Patient appears safe for discharge with close PCP/cardiology follow up. I discussed results, treatment plan, need for PCP follow-up, and return precautions with the patient. Provided opportunity for questions, patient confirmed understanding and is in agreement with plan.    Portions of this note were generated with Scientist, clinical (histocompatibility and immunogenetics)Dragon dictation software. Dictation errors may occur despite best attempts at proofreading.         Final Clinical Impression(s) / ED Diagnoses Final diagnoses:  Left-sided chest pain  Neck pain  Palpitations    Rx / DC Orders ED Discharge Orders          Ordered    methocarbamol (ROBAXIN) 500 MG tablet  2 times daily        07/10/21 1203    naproxen (NAPROSYN) 500 MG tablet  2 times daily        07/10/21 1203    methylPREDNISolone (MEDROL DOSEPAK) 4 MG TBPK tablet        07/10/21 1203              Jodi GeraldsFord, Earsel Shouse EnhautN, [place] JerseyPA-C 07/10/21 1314    Maia PlanLong, Joshua G, MD 07/10/21 [phone]

## 2021-07-10 NOTE — Discharge Instructions (Signed)
Your evaluation today has been reassuring, labs look good and do not suggest a problem with your heart, chest x-ray was clear as well.  I suspect some of this pain may actually be coming more so from your neck and muscle spasm compressing the nerves in this area.  To treat the symptoms take naproxen twice daily, you can use Robaxin as needed for muscle spasm, use caution with this medication as it can cause drowsiness, do not drive after taking, and take Medrol Dosepak to reduce inflammation.  You can also use over-the-counter Salonpas lidocaine patches for 12 hours at a time.  Your blood pressure was elevated today, this has improved with treatment of your pain but you should follow-up closely with a primary care doctor for continued monitoring of your blood pressure.  If you continue to experience palpitations or chest pain you should follow-up with cardiology.  Return for new or worsening symptoms.

## 2021-07-11 ENCOUNTER — Encounter: Payer: Self-pay | Admitting: Cardiovascular Disease

## 2021-07-11 ENCOUNTER — Ambulatory Visit (INDEPENDENT_AMBULATORY_CARE_PROVIDER_SITE_OTHER): Payer: 59 | Admitting: Cardiovascular Disease

## 2021-07-11 VITALS — BP 128/84 | HR 86 | Ht 69.0 in | Wt 272.8 lb

## 2021-07-11 DIAGNOSIS — R072 Precordial pain: Secondary | ICD-10-CM

## 2021-07-11 DIAGNOSIS — R030 Elevated blood-pressure reading, without diagnosis of hypertension: Secondary | ICD-10-CM

## 2021-07-11 DIAGNOSIS — M940 Chondrocostal junction syndrome [Tietze]: Secondary | ICD-10-CM

## 2021-07-11 MED ORDER — NAPROXEN 500 MG PO TABS: 500.0000 mg | ORAL_TABLET | Freq: Two times a day (BID) | ORAL | 0 refills | Status: AC

## 2021-07-11 NOTE — Patient Instructions (Addendum)
Medication Instructions:  Take Naproxen 500 mg twice daily for 7 days.   *If you need a refill on your cardiac medications before your next appointment, please call your pharmacy*   Lab Work: ESR, CRP, TSH today   If you have labs (blood work) drawn today and your tests are completely normal, you will receive your results only by: [place] (if you have MyChart) OR A paper copy in the mail If you have any lab test that is abnormal or we need to change your treatment, we will call you to review the results.   Testing/Procedures: Echocardiogram - Your physician has requested that you have an echocardiogram. Echocardiography is a painless test that uses sound waves to create images of your heart. It provides your doctor with information about the size and shape of your heart and how well your hearts chambers and valves are working. This procedure takes approximately one hour. There are no restrictions for this procedure. This will be performed at either our [facility] location - [address] [place] location [facility] 2nd floor.    Follow-Up: At [facility], you and your health needs are our priority.  As part of our continuing mission to provide you with exceptional heart care, we have created designated Provider Care Teams.  These Care Teams include your primary Cardiologist (physician) and Advanced Practice Providers (APPs -  Physician Assistants and Nurse Practitioners) who all work together to provide you with the care you need, when you need it.  We recommend signing up for the patient portal called "MyChart".  Sign up information is provided on this After Visit Summary.  MyChart is used to connect with patients for Virtual Visits (Telemedicine).  Patients are able to view lab/test results, encounter notes, upcoming appointments, etc.  Non-urgent messages can be sent to your provider as well.   To learn more about what you can do with  MyChart, go to [email or web address].    Your next appointment:   As needed  The format for your next appointment:   In Person  Provider:   Eleonore Chiquito, MD    Other Instructions Take BP at home every other day.

## 2021-07-11 NOTE — Progress Notes (Signed)
Cardiology Office Note:   Date:  07/11/2021  NAME:  Gavin Francis    MRN: [id number] DOB:  April 20, 1986   PCP:  Wynn Banker, PA  Cardiologist:  None  Electrophysiologist:  None   Referring MD: Margette Fast, MD   Chief Complaint  Patient presents with   Chest Pain    History of Present Illness:   Gavin Francis is a 36 y.o. male with a hx of HTN who is being seen today for the evaluation of chest pain at the request of Long, Wonda Olds, MD. seen in the emergency room on 07/10/2021 for chest pain.  Lab work unremarkable.  She was slightly hypokalemic with a potassium of 3.4.  High sensitive troponin 4 and 5 on repeat.  Chest x-ray normal. He reports 2 days ago he developed dullness and throbbing pain in his left chest.  He also reports the pain went into his left neck.  He reports it also occurred under his left clavicle.  Symptoms are rather constant.  They are not worsened by position.  He reports activity actually made it better.  He reports stretching also improved it.  He was given a prescription for naproxen as well as a steroid.  He has not taken this.  He also reports some fluttering in his chest with this.  He denies any stress.  Blood pressure was elevated in the emergency room but is normal here.  His BMI is 40.  He reports he is working on losing weight.  Cardiac enzymes are negative.  EKG really unremarkable.  EKG here demonstrates sinus rhythm with nonspecific ST-T changes.  He is exquisitely tender over the left chest.  I suspect this is costochondritis.  No strong family history of heart disease.  He reports he is not married.  He does not have any children.  His blood pressure is 120/84.  Weight 272 pounds.  BMI 41.  He reports sleep apnea is a possibility.  Symptoms have continued but not improved all the way.  Again he has not started his NSAID.  Problem List HTN  Past Medical History: Past Medical History:  Diagnosis Date   Seasonal allergies     Past Surgical  History: History reviewed. No pertinent surgical history.  Current Medications: Current Meds  Medication Sig   methocarbamol (ROBAXIN) 500 MG tablet Take 1 tablet (500 mg total) by mouth 2 (two) times daily.   methylPREDNISolone (MEDROL DOSEPAK) 4 MG TBPK tablet Take as directed   [DISCONTINUED] naproxen (NAPROSYN) 500 MG tablet Take 1 tablet (500 mg total) by mouth 2 (two) times daily.     Allergies:    Wasp venom and Hornet venom   Social History: Social History   Socioeconomic History   Marital status: Single    Spouse name: Not on file   Number of children: Not on file   Years of education: Not on file   Highest education level: Not on file  Occupational History   Occupation: Associate Professor  Tobacco Use   Smoking status: Never   Smokeless tobacco: Not on file  Substance and Sexual Activity   Alcohol use: No   Drug use: No   Sexual activity: Not on file  Other Topics Concern   Not on file  Social History Narrative   Not on file   Social Determinants of Health   Financial Resource Strain: Not on file  Food Insecurity: Not on file  Transportation Needs: Not on file  Physical Activity:  Not on file  Stress: Not on file  Social Connections: Not on file     Family History: The patient's family history includes Hyperlipidemia in his father.  ROS:   All other ROS reviewed and negative. Pertinent positives noted in the HPI.     EKGs/Labs/Other Studies Reviewed:   The following studies were personally reviewed by me today:  EKG:  EKG is ordered today.  The ekg ordered today demonstrates NSR 86 bpm inferolateral TWI, and was personally reviewed by me.   TTE 07/30/2019 Left Ventricle: There is borderline concentric hypertrophy. Systolic  function is normal. EF: 60-65%. The quantitative EF by 3D imaging is 60 %.  Diastolic function and filling pressure is normal.    Pericardium: There is a trivial localized pericardial effusion noted  around RA versus epicardial  fat.   No significant valvular disease noted.  NM Stress 07/30/2019 FINDINGS:  No significant fixed or reversible defect in radiotracer uptake of the left ventricle to suggest infarct or ischemia. Normal wall motion    IMPRESSION:  No findings for left ventricular infarct or reversible ischemia.     Recent Labs: 07/10/2021: ALT 33; BUN 10; Creatinine, Ser 1.06; Hemoglobin 13.8; Magnesium 2.2; Platelets 220; Potassium 3.4; Sodium 139   Recent Lipid Panel No results found for: CHOL, TRIG, HDL, CHOLHDL, VLDL, LDLCALC, LDLDIRECT  Physical Exam:   VS:  BP 128/84    Pulse 86    Ht 5' 9" (1.753 m)    Wt 272 lb 12.8 oz (123.7 kg)    SpO2 98%    BMI 40.29 kg/m    Wt Readings from Last 3 Encounters:  07/11/21 272 lb 12.8 oz (123.7 kg)  07/10/21 270 lb (122.5 kg)  11/17/15 250 lb (113.4 kg)    General: Well nourished, well developed, in no acute distress Head: Atraumatic, normal size  Eyes: PEERLA, EOMI  Neck: Supple, no JVD Endocrine: No thryomegaly Cardiac: Normal S1, S2; RRR; no murmurs, rubs, or gallops Lungs: Clear to auscultation bilaterally, no wheezing, rhonchi or rales  Abd: Soft, nontender, no hepatomegaly  Ext: No edema, pulses 2+ Musculoskeletal: No deformities, BUE and BLE strength normal and equal Skin: Warm and dry, no rashes   Neuro: Alert and oriented to person, place, time, and situation, CNII-XII grossly intact, no focal deficits  Psych: Normal mood and affect   ASSESSMENT:   JESSTIN Francis is a 36 y.o. male who presents for the following: 1. Precordial pain   2. Costochondritis   3. Obesity, morbid, BMI 40.0-49.9 (HCC)   4. Elevated BP without diagnosis of hypertension     PLAN:   1. Precordial pain 2. Costochondritis -2 days of noncardiac chest pain.  He has palpable pain in his left chest.  I suspect this is costochondritis.  I would like to check an ESR and CRP just to make sure there is no pericarditis.  His symptoms are really inconsistent with this also  his EKG is inconsistent with this. -His EKG shows nonspecific ST-T changes.  I would like for him to get an echo.  I suspect he may have higher blood pressure at home.  He will start checking a log. -He really is too young to have obstructive CAD.  I suspect musculoskeletal is more likely etiology. -We will also check a TSH. -I would like for him to take naproxen 500 mg twice a day for 7 days.  He was also given a steroid pack in the hospital and this is okay to take.  He should drink plenty of water with this.  I suspect this will help his symptoms.  3. Obesity, morbid, BMI 40.0-49.9 ([place]) -Weight loss recommended.  4. Elevated BP without diagnosis of hypertension -Blood pressure was elevated in the emergency room.  This could be contributing.  He was started keep a log every other day.  BP values within limits here.  He should also work on diet and exercise.  Disposition: Return if symptoms worsen or fail to improve.  Medication Adjustments/Labs and Tests Ordered: Current medicines are reviewed at length with the patient today.  Concerns regarding medicines are outlined above.  Orders Placed This Encounter  Procedures   Sedimentation rate   C-reactive protein   TSH   EKG 12-Lead   ECHOCARDIOGRAM COMPLETE   Meds ordered this encounter  Medications   naproxen (NAPROSYN) 500 MG tablet    Sig: Take 1 tablet (500 mg total) by mouth 2 (two) times daily.    Dispense:  14 tablet    Refill:  0    Patient Instructions  Medication Instructions:  Take Naproxen 500 mg twice daily for 7 days.   *If you need a refill on your cardiac medications before your next appointment, please call your pharmacy*   Lab Work: ESR, CRP, TSH today   If you have labs (blood work) drawn today and your tests are completely normal, you will receive your results only by: [facility] (if you have MyChart) OR A paper copy in the mail If you have any lab test that is abnormal or we need to change your  treatment, we will call you to review the results.   Testing/Procedures: Echocardiogram - Your physician has requested that you have an echocardiogram. Echocardiography is a painless test that uses sound waves to create images of your heart. It provides your doctor with information about the size and shape of your heart and how well your hearts chambers and valves are working. This procedure takes approximately one hour. There are no restrictions for this procedure. This will be performed at either our [facility] location - [address] [place] location [facility] 2nd floor.    Follow-Up: At [facility], you and your health needs are our priority.  As part of our continuing mission to provide you with exceptional heart care, we have created designated Provider Care Teams.  These Care Teams include your primary Cardiologist (physician) and Advanced Practice Providers (APPs -  Physician Assistants and Nurse Practitioners) who all work together to provide you with the care you need, when you need it.  We recommend signing up for the patient portal called "MyChart".  Sign up information is provided on this After Visit Summary.  MyChart is used to connect with patients for Virtual Visits (Telemedicine).  Patients are able to view lab/test results, encounter notes, upcoming appointments, etc.  Non-urgent messages can be sent to your provider as well.   To learn more about what you can do with MyChart, go to [email or web address].    Your next appointment:   As needed  The format for your next appointment:   In Person  Provider:   Eleonore Chiquito, MD    Other Instructions Take BP at home every other day.      Signed, Addison Naegeli. Audie Box, MD, [place]  [address] [place], [place] [address] [phone]  07/11/2021 2:29 PM

## 2021-07-12 LAB — SEDIMENTATION RATE: Sed Rate: 8 mm/hr (ref 0–15)

## 2021-07-12 LAB — TSH: TSH: 2.56 u[IU]/mL (ref 0.450–4.500)

## 2021-07-12 LAB — C-REACTIVE PROTEIN: CRP: 7 mg/L (ref 0–10)

## 2021-07-14 ENCOUNTER — Encounter: Payer: Self-pay | Admitting: *Deleted

## 2021-07-25 ENCOUNTER — Ambulatory Visit (HOSPITAL_COMMUNITY): Payer: 59 | Attending: Internal Medicine

## 2021-07-25 ENCOUNTER — Encounter (HOSPITAL_COMMUNITY): Payer: Self-pay

## 2021-07-25 ENCOUNTER — Other Ambulatory Visit: Payer: Self-pay

## 2021-07-25 DIAGNOSIS — R072 Precordial pain: Secondary | ICD-10-CM | POA: Diagnosis present

## 2021-07-25 LAB — ECHOCARDIOGRAM COMPLETE
Area-P 1/2: 3.37 cm2
S' Lateral: 2.65 cm

## 2022-10-03 ENCOUNTER — Ambulatory Visit
Admission: EM | Admit: 2022-10-03 | Discharge: 2022-10-03 | Disposition: A | Discharge status: Home / Self Care | Payer: 59 | Attending: Internal Medicine | Admitting: Internal Medicine

## 2022-10-03 DIAGNOSIS — J029 Acute pharyngitis, unspecified: Secondary | ICD-10-CM

## 2022-10-03 DIAGNOSIS — Z202 Contact with and (suspected) exposure to infections with a predominantly sexual mode of transmission: Secondary | ICD-10-CM | POA: Diagnosis present

## 2022-10-03 DIAGNOSIS — Z113 Encounter for screening for infections with a predominantly sexual mode of transmission: Secondary | ICD-10-CM | POA: Diagnosis present

## 2022-10-03 LAB — POCT RAPID STREP A (OFFICE): Rapid Strep A Screen: NEGATIVE

## 2022-10-03 MED ORDER — CHLORASEPTIC 1.4 % MT LIQD: 1.0000 | OROMUCOSAL | 0 refills | Status: AC | PRN

## 2022-10-03 NOTE — Discharge Instructions (Signed)
Strep is negative.  Throat culture and STD test is pending.  Will call if it is abnormal.  I have prescribed a throat spray to help alleviate discomfort.  Follow-up if any symptoms persist or worsen.

## 2022-10-03 NOTE — ED Triage Notes (Signed)
Pt states he was exposed to an STD 2 weeks ago, states he was seen at an urgent care and has developed a sore throat today.   States he wants to know if he has a sore throat because he has an STD or because he is sick. States he has felt nauseas.

## 2022-10-03 NOTE — ED Provider Notes (Signed)
[facility]    CSN: [id number] Arrival date & time: 10/03/22  1815      History   Chief Complaint Chief Complaint  Patient presents with   Sore Throat    HPI Gavin Francis is a 37 y.o. male.   Patient presents with sore throat that started today.  He denies nasal congestion, runny nose, cough, fever.  Denies any known sick contacts.  Patient does report nausea without vomiting but is attributing this to the medications that he has been taking which are doxycycline and metronidazole.  Patient reports that on 3/26 he was treated for chlamydia and trichomonas exposure with doxycycline and metronidazole.  He has approximately 3 days left of doxycycline.  Patient is concerned for STD in the throat causing his sore throat given that he did perform oral intercourse on his male sexual partner who exposed him to trichomonas and chlamydia. States that his STD testing came back negative at recent visit. He was not having any symptoms at that time and still denies any STD related symptoms.    Sore Throat    Past Medical History:  Diagnosis Date   Seasonal allergies     There are no problems to display for this patient.   No past surgical history on file.     Home Medications    Prior to Admission medications   Medication Sig Start Date End Date Taking? Authorizing Provider  phenol (CHLORASEPTIC) 1.4 % LIQD Use as directed 1 spray in the mouth or throat as needed for throat irritation / pain. 10/03/22  Yes Chauncey Bruno, Hildred Alamin E, FNP  ciprofloxacin (CIPRO) 500 MG tablet Take 1 tablet (500 mg total) by mouth 2 (two) times daily. Patient not taking: Reported on 07/10/2021 11/17/15   Veryl Speak, MD  HYDROcodone-acetaminophen (NORCO) 5-325 MG tablet Take 1-2 tablets by mouth every 6 (six) hours as needed. Patient not taking: Reported on 07/10/2021 11/17/15   Veryl Speak, MD  methocarbamol (ROBAXIN) 500 MG tablet Take 1 tablet (500 mg total) by mouth 2 (two) times daily. 07/10/21   Jacqlyn Larsen, PA-C  methylPREDNISolone (MEDROL DOSEPAK) 4 MG TBPK tablet Take as directed 07/10/21   Jacqlyn Larsen, PA-C  naproxen (NAPROSYN) 500 MG tablet Take 1 tablet (500 mg total) by mouth 2 (two) times daily. 07/11/21   O'Neal, Cassie Freer, MD    Family History Family History  Problem Relation Age of Onset   Hyperlipidemia Father     Social History Social History   Tobacco Use   Smoking status: Never  Substance Use Topics   Alcohol use: No   Drug use: No     Allergies   Wasp venom, Isosorbide, and Hornet venom   Review of Systems Review of Systems Per HPI  Physical Exam Triage Vital Signs ED Triage Vitals  Enc Vitals Group     BP 10/03/22 2005 119/77     Pulse Rate 10/03/22 2005 70     Resp 10/03/22 2005 18     Temp 10/03/22 2005 98.9 F (37.2 C)     Temp Source 10/03/22 2005 Oral     SpO2 10/03/22 2005 95 %     Weight --      Height --      Head Circumference --      Peak Flow --      Pain Score 10/03/22 2004 3     Pain Loc --      Pain Edu? --  Excl. in GC? --    No data found.  Updated Vital Signs BP 119/77 (BP Location: Left Arm)   Pulse 70   Temp 98.9 F (37.2 C) (Oral)   Resp 18   SpO2 95%   Visual Acuity Right Eye Distance:   Left Eye Distance:   Bilateral Distance:    Right Eye Near:   Left Eye Near:    Bilateral Near:     Physical Exam Constitutional:      General: He is not in acute distress.    Appearance: Normal appearance. He is not toxic-appearing or diaphoretic.  HENT:     Head: Normocephalic and atraumatic.     Right Ear: Tympanic membrane and ear canal normal.     Left Ear: Tympanic membrane and ear canal normal.     Nose: No congestion.     Mouth/Throat:     Mouth: Mucous membranes are moist.     Pharynx: Posterior oropharyngeal erythema present.     Tonsils: No tonsillar abscesses.  Eyes:     Extraocular Movements: Extraocular movements intact.     Conjunctiva/sclera: Conjunctivae normal.     Pupils: Pupils  are equal, round, and reactive to light.  Cardiovascular:     Rate and Rhythm: Normal rate and regular rhythm.     Pulses: Normal pulses.     Heart sounds: Normal heart sounds.  Pulmonary:     Effort: Pulmonary effort is normal. No respiratory distress.     Breath sounds: Normal breath sounds. No stridor. No wheezing, rhonchi or rales.  Abdominal:     General: Abdomen is flat. Bowel sounds are normal.     Palpations: Abdomen is soft.  Musculoskeletal:        General: Normal range of motion.     Cervical back: Normal range of motion.  Skin:    General: Skin is warm and dry.  Neurological:     General: No focal deficit present.     Mental Status: He is alert and oriented to person, place, and time. Mental status is at baseline.  Psychiatric:        Mood and Affect: Mood normal.        Behavior: Behavior normal.        Thought Content: Thought content normal.        Judgment: Judgment normal.      UC Treatments / Results  Labs (all labs ordered are listed, but only abnormal results are displayed) Labs Reviewed  CULTURE, GROUP A STREP [facility])  POCT RAPID STREP A (OFFICE)  CYTOLOGY, (ORAL, ANAL, URETHRAL) ANCILLARY ONLY    EKG   Radiology No results found.  Procedures Procedures (including critical care time)  Medications Ordered in UC Medications - No data to display  Initial Impression / Assessment and Plan / UC Course  I have reviewed the triage vital signs and the nursing notes.  Pertinent labs & imaging results that were available during my care of the patient were reviewed by me and considered in my medical decision making (see chart for details).     Rapid strep is negative.  Throat culture pending.  Cytology swab for STDs of the pharynx is also pending.  I have a low suspicion for STD exposure in the pharynx given the patient was already treated for exposure.  Suspect possible viral component to patient's symptoms today.  Advised supportive care and symptom  management related to this.  Advised strict return precautions.  Patient verbalized understanding and was agreeable  with plan. Final Clinical Impressions(s) / UC Diagnoses   Final diagnoses:  Sore throat  Exposure to STD  Screening examination for venereal disease     Discharge Instructions      Strep is negative.  Throat culture and STD test is pending.  Will call if it is abnormal.  I have prescribed a throat spray to help alleviate discomfort.  Follow-up if any symptoms persist or worsen.    ED Prescriptions     Medication Sig Dispense Auth. Provider   phenol (CHLORASEPTIC) 1.4 % LIQD Use as directed 1 spray in the mouth or throat as needed for throat irritation / pain. 118 mL Teodora Medici, [place]      PDMP not reviewed this encounter.   Teodora Medici, [place] 10/03/22 2248

## 2022-10-06 LAB — CYTOLOGY, (ORAL, ANAL, URETHRAL) ANCILLARY ONLY
Chlamydia: NEGATIVE
Comment: NEGATIVE
Comment: NEGATIVE
Comment: NORMAL
Neisseria Gonorrhea: NEGATIVE
Trichomonas: NEGATIVE

## 2022-10-07 LAB — CULTURE, GROUP A STREP (THRC)
